# Patient Record
Sex: Female | Born: 1970 | Race: White | Hispanic: No | Marital: Married | Smoking: Never smoker
Health system: Southern US, Community
[De-identification: ages and names within clinical notes are randomized; demographics above are authoritative.]

## PROBLEM LIST (undated history)

## (undated) DIAGNOSIS — G8929 Other chronic pain: Secondary | ICD-10-CM

## (undated) DIAGNOSIS — M549 Dorsalgia, unspecified: Secondary | ICD-10-CM

## (undated) DIAGNOSIS — R519 Headache, unspecified: Secondary | ICD-10-CM

## (undated) DIAGNOSIS — R002 Palpitations: Secondary | ICD-10-CM

## (undated) HISTORY — DX: Palpitations: R00.2

---

## 2009-03-29 ENCOUNTER — Ambulatory Visit: Payer: Self-pay | Admitting: Diagnostic Radiology

## 2009-03-29 ENCOUNTER — Emergency Department (HOSPITAL_BASED_OUTPATIENT_CLINIC_OR_DEPARTMENT_OTHER): Admission: EM | Admit: 2009-03-29 | Discharge: 2009-03-29 | Payer: Self-pay | Admitting: Emergency Medicine

## 2010-05-28 LAB — CBC
MCHC: 34.9 g/dL (ref 30.0–36.0)
RBC: 4.12 MIL/uL (ref 3.87–5.11)
WBC: 6.4 10*3/uL (ref 4.0–10.5)

## 2010-05-28 LAB — COMPREHENSIVE METABOLIC PANEL
AST: 21 U/L (ref 0–37)
Albumin: 4.5 g/dL (ref 3.5–5.2)
Alkaline Phosphatase: 67 U/L (ref 39–117)
Chloride: 110 mEq/L (ref 96–112)
Potassium: 3.4 mEq/L — ABNORMAL LOW (ref 3.5–5.1)
Sodium: 142 mEq/L (ref 135–145)
Total Bilirubin: 0.3 mg/dL (ref 0.3–1.2)

## 2010-05-28 LAB — LIPASE, BLOOD: Lipase: 375 U/L — ABNORMAL HIGH (ref 23–300)

## 2010-05-28 LAB — PROTIME-INR: Prothrombin Time: 12.3 seconds (ref 11.6–15.2)

## 2010-05-28 LAB — POCT CARDIAC MARKERS
CKMB, poc: <1 ng/mL — ABNORMAL LOW (ref 1.0–8.0)
Myoglobin, poc: 41.3 ng/mL (ref 12–200)

## 2010-05-28 LAB — DIFFERENTIAL
Basophils Relative: 0 % (ref 0–1)
Eosinophils Absolute: 0.2 10*3/uL (ref 0.0–0.7)
Monocytes Relative: 5 % (ref 3–12)
Neutrophils Relative %: 60 % (ref 43–77)

## 2019-05-08 ENCOUNTER — Ambulatory Visit: Payer: Self-pay | Attending: Internal Medicine

## 2019-05-08 DIAGNOSIS — Z23 Encounter for immunization: Secondary | ICD-10-CM | POA: Insufficient documentation

## 2019-05-08 NOTE — Progress Notes (Signed)
   Covid-19 Vaccination Clinic  Name:  Christina Ramos    MRN: [id number] DOB: 12/22/1970  05/08/2019  [name] was observed post Covid-19 immunization for 15 minutes without incidence. She was provided with Vaccine Information Sheet and instruction to access the V-Safe system.   [name] was instructed to call 911 with any severe reactions post vaccine: Marland Kitchen Difficulty breathing  . Swelling of your face and throat  . A fast heartbeat  . A bad rash all over your body  . Dizziness and weakness    Immunizations Administered    Name Date Dose VIS Date Route   Pfizer COVID-19 Vaccine 05/08/2019  1:59 PM 0.3 mL 02/19/2019 Intramuscular   Manufacturer: [employer], [employer]   Lot: [id number]   NDC: [address]-5

## 2019-05-29 ENCOUNTER — Ambulatory Visit: Payer: Self-pay | Attending: Internal Medicine

## 2019-05-29 DIAGNOSIS — Z23 Encounter for immunization: Secondary | ICD-10-CM

## 2019-05-29 NOTE — Progress Notes (Signed)
   Covid-19 Vaccination Clinic  Name:  Christina Ramos    MRN: [id number] DOB: 1970-10-28  05/29/2019  [name] was observed post Covid-19 immunization for 15 minutes without incident. She was provided with Vaccine Information Sheet and instruction to access the V-Safe system.   Ms. Park was instructed to call 911 with any severe reactions post vaccine: Marland Kitchen Difficulty breathing  . Swelling of face and throat  . A fast heartbeat  . A bad rash all over body  . Dizziness and weakness   Immunizations Administered    Name Date Dose VIS Date Route   Pfizer COVID-19 Vaccine 05/29/2019  2:10 PM 0.3 mL 02/19/2019 Intramuscular   Manufacturer: [employer], [employer]   Lot: [id number]   NDC: [address]-2

## 2019-12-26 ENCOUNTER — Emergency Department (HOSPITAL_BASED_OUTPATIENT_CLINIC_OR_DEPARTMENT_OTHER)
Admission: EM | Admit: 2019-12-26 | Discharge: 2019-12-26 | Disposition: A | Discharge status: Home / Self Care | Payer: 59 | Attending: Emergency Medicine | Admitting: Emergency Medicine

## 2019-12-26 ENCOUNTER — Encounter (HOSPITAL_BASED_OUTPATIENT_CLINIC_OR_DEPARTMENT_OTHER): Payer: Self-pay | Admitting: *Deleted

## 2019-12-26 ENCOUNTER — Other Ambulatory Visit: Payer: Self-pay

## 2019-12-26 DIAGNOSIS — T402X5A Adverse effect of other opioids, initial encounter: Secondary | ICD-10-CM | POA: Diagnosis not present

## 2019-12-26 DIAGNOSIS — R42 Dizziness and giddiness: Secondary | ICD-10-CM | POA: Diagnosis not present

## 2019-12-26 DIAGNOSIS — R0602 Shortness of breath: Secondary | ICD-10-CM | POA: Diagnosis not present

## 2019-12-26 DIAGNOSIS — R0600 Dyspnea, unspecified: Secondary | ICD-10-CM

## 2019-12-26 DIAGNOSIS — T50905A Adverse effect of unspecified drugs, medicaments and biological substances, initial encounter: Secondary | ICD-10-CM

## 2019-12-26 HISTORY — DX: Dorsalgia, unspecified: M54.9

## 2019-12-26 HISTORY — DX: Other chronic pain: G89.29

## 2019-12-26 HISTORY — DX: Headache, unspecified: R51.9

## 2019-12-26 LAB — CBC WITH DIFFERENTIAL/PLATELET
Abs Immature Granulocytes: 0.04 10*3/uL (ref 0.00–0.07)
Basophils Absolute: 0 10*3/uL (ref 0.0–0.1)
Basophils Relative: 0 %
Eosinophils Absolute: 0.3 10*3/uL (ref 0.0–0.5)
Eosinophils Relative: 4 %
HCT: 37.7 % (ref 36.0–46.0)
Hemoglobin: 13.1 g/dL (ref 12.0–15.0)
Immature Granulocytes: 1 %
Lymphocytes Relative: 35 %
Lymphs Abs: 2.5 10*3/uL (ref 0.7–4.0)
MCH: 31.4 pg (ref 26.0–34.0)
MCHC: 34.7 g/dL (ref 30.0–36.0)
MCV: 90.4 fL (ref 80.0–100.0)
Monocytes Absolute: 0.4 10*3/uL (ref 0.1–1.0)
Monocytes Relative: 5 %
Neutro Abs: 4.1 10*3/uL (ref 1.7–7.7)
Neutrophils Relative %: 55 %
Platelets: 255 10*3/uL (ref 150–400)
RBC: 4.17 MIL/uL (ref 3.87–5.11)
RDW: 11.9 % (ref 11.5–15.5)
WBC: 7.4 10*3/uL (ref 4.0–10.5)
nRBC: 0 % (ref 0.0–0.2)

## 2019-12-26 LAB — COMPREHENSIVE METABOLIC PANEL
ALT: 27 U/L (ref 0–44)
AST: 21 U/L (ref 15–41)
Albumin: 4.4 g/dL (ref 3.5–5.0)
Alkaline Phosphatase: 46 U/L (ref 38–126)
Anion gap: 11 (ref 5–15)
BUN: 21 mg/dL — ABNORMAL HIGH (ref 6–20)
CO2: 21 mmol/L — ABNORMAL LOW (ref 22–32)
Calcium: 9.1 mg/dL (ref 8.9–10.3)
Chloride: 103 mmol/L (ref 98–111)
Creatinine, Ser: 0.93 mg/dL (ref 0.44–1.00)
GFR, Estimated: >60 mL/min (ref >60)
Glucose, Bld: 141 mg/dL — ABNORMAL HIGH (ref 70–99)
Potassium: 3.7 mmol/L (ref 3.5–5.1)
Sodium: 135 mmol/L (ref 135–145)
Total Bilirubin: 0.4 mg/dL (ref 0.3–1.2)
Total Protein: 7.6 g/dL (ref 6.5–8.1)

## 2019-12-26 LAB — BRAIN NATRIURETIC PEPTIDE: B Natriuretic Peptide: 36.5 pg/mL (ref 0.0–100.0)

## 2019-12-26 LAB — TROPONIN I (HIGH SENSITIVITY)
Troponin I (High Sensitivity): 2 ng/L (ref <18)
Troponin I (High Sensitivity): 2 ng/L (ref <18)

## 2019-12-26 MED ORDER — LORAZEPAM 1 MG PO TABS
1.0000 mg | ORAL_TABLET | Freq: Once | ORAL | Status: AC
  Administered 2019-12-26: 1 mg via ORAL
  Filled 2019-12-26: qty 1

## 2019-12-26 NOTE — ED Triage Notes (Addendum)
Pt states she has been taking hydrocodone for the past couple days for acute back pain.  States tonight she felt like she was "breathing shallow" and concerned about her medications "interacting". Also c/o feeling "light headed"  states she did a tele MD visit and was told she should be seen in the ED. No other complaints. Denies c/p.

## 2019-12-26 NOTE — ED Notes (Signed)
Pt endorses not driving

## 2019-12-26 NOTE — ED Provider Notes (Addendum)
[facility] EMERGENCY DEPARTMENT Provider Note   CSN: [id number] Arrival date & time: 12/26/19  0315     History Chief Complaint  Patient presents with  . Medication Reaction    Christina Ramos is a 49 y.o. female.  HPI     49 year old female comes in a chief complaint of medication side effect. Patient was recently diagnosed with sciatica and started on Norco.  She has been taking 1 tablet every night for the last 3 nights.  Tonight she took her medicine at midnight and went to bed around 12:30 AM.  She started noticing that her respirations were shallow and that she was gasping for air.  She called the nurse line and was advised to come to the ER.  Review of system is positive for lightheadedness.  Patient denies any headaches, numbness, tingling, vision change, slurred speech.  She denies any vomiting, diarrhea and her p.o. intake has been normal.  No history of strokes.  Patient has history of migraines and is prediabetic.  She does not smoke, denies any alcohol consumption.  Past Medical History:  Diagnosis Date  . Back pain   . Chronic headaches     There are no problems to display for this patient.   History reviewed. No pertinent surgical history.   OB History   No obstetric history on file.     No family history on file.  Social History   Tobacco Use  . Smoking status: Never Smoker  . Smokeless tobacco: Never Used  Substance Use Topics  . Alcohol use: Never  . Drug use: Never    Home Medications Prior to Admission medications   Medication Sig Start Date End Date Taking? Authorizing Provider  HYDROcodone-acetaminophen (NORCO/VICODIN) 5-325 MG tablet Take by mouth. 12/23/19  Yes [provider]  topiramate (TOPAMAX) 50 MG tablet Take 3 tablets by mouth daily. 09/06/19  Yes [provider]  verapamil (VERELAN PM) 180 MG 24 hr capsule Take 1 capsule by mouth daily. 02/14/15  Yes [provider]  loratadine (CLARITIN)  10 MG tablet Take by mouth.    [provider]    Allergies    Amoxicillin, Penicillins, and Sulfa antibiotics  Review of Systems   Review of Systems  Constitutional: Positive for activity change.  Eyes: Negative for visual disturbance.  Respiratory: Positive for shortness of breath.   Cardiovascular: Negative for chest pain.  Neurological: Positive for dizziness and light-headedness. Negative for seizures, syncope, facial asymmetry, speech difficulty, weakness, numbness and headaches.  All other systems reviewed and are negative.   Physical Exam Updated Vital Signs BP 97/67 (BP Location: Left Arm)   Pulse 62   Temp 97.9 F (36.6 C)   Resp 19   Ht 5' (1.524 m)   Wt 72.6 kg   SpO2 97%   BMI 31.25 kg/m   Physical Exam Vitals and nursing note reviewed.  Constitutional:      Appearance: She is well-developed.  HENT:     Head: Normocephalic and atraumatic.  Eyes:     Pupils: Pupils are equal, round, and reactive to light.  Cardiovascular:     Rate and Rhythm: Normal rate and regular rhythm.     Heart sounds: Normal heart sounds. No murmur heard.   Pulmonary:     Effort: Pulmonary effort is normal. No respiratory distress.  Abdominal:     General: There is no distension.     Palpations: Abdomen is soft.     Tenderness: There is no  abdominal tenderness. There is no guarding or rebound.  Musculoskeletal:     Cervical back: Neck supple.  Skin:    General: Skin is warm and dry.  Neurological:     General: No focal deficit present.     Mental Status: She is alert and oriented to person, place, and time.     Cranial Nerves: No cranial nerve deficit.     Sensory: No sensory deficit.     Motor: No weakness.     Coordination: Coordination normal.     ED Results / Procedures / Treatments   Labs (all labs ordered are listed, but only abnormal results are displayed) Labs Reviewed  COMPREHENSIVE METABOLIC PANEL - Abnormal; Notable for the following components:        Result Value   CO2 21 (*)    Glucose, Bld 141 (*)    BUN 21 (*)    All other components within normal limits  CBC WITH DIFFERENTIAL/PLATELET  BRAIN NATRIURETIC PEPTIDE  TROPONIN I (HIGH SENSITIVITY)  TROPONIN I (HIGH SENSITIVITY)  TROPONIN I (HIGH SENSITIVITY)    EKG None   Date: 12/26/2019  Rate: 62  Rhythm: normal sinus rhythm  QRS Axis: normal  Intervals: normal  ST/T Wave abnormalities: normal  Conduction Disutrbances: none  Narrative Interpretation: unremarkable      Radiology No results found.  Procedures Procedures (including critical care time)  Medications Ordered in ED Medications  LORazepam (ATIVAN) tablet 1 mg (has no administration in time range)    ED Course  I have reviewed the triage vital signs and the nursing notes.  Pertinent labs & imaging results that were available during my care of the patient were reviewed by me and considered in my medical decision making (see chart for details).  Clinical Course as of Dec 25 699  Wynelle Link Dec 26, 2019  7616 Patient reassessed.  She reports that she continues to have some shortness of breath type feeling.  Her dizziness is resolved.  Overall, she is feeling better than she did when she first came.  Plan is to get delta troponin to rule out ACS.  If her work-up is negative she can go home with close PCP follow-up.  Patient is comfortable with the plan.   [AN]    Clinical Course User Index [AN] Derwood Kaplan, MD   MDM Rules/Calculators/A&P                          49 year old female comes in a chief complaint of shallow respirations/shortness of breath and feeling dizzy.  Her symptoms started about 30 minutes after she took Norco.  She typically does not take pain meds.  She did not have similar symptoms yesterday, or day before when she started taking Norco.  She did take Norco later in the night tonight.  Patient is having dizziness but her neuro exam is reassuring.  We ambulated her and she had no  ataxia.  There was no dysmetria on neuro exam or nystagmus.  Doubt that she is having stroke.    We will get basic labs in the ED and monitor the patient.  Anticipate discharge.  Final Clinical Impression(s) / ED Diagnoses Final diagnoses:  Medication side effect, initial encounter  Dyspnea, unspecified type    Rx / DC Orders ED Discharge Orders    None       Derwood Kaplan, MD 12/26/19 0737    Derwood Kaplan, MD 12/26/19 1062

## 2019-12-26 NOTE — ED Notes (Signed)
Pt discharged to home. Discharge instructions have been discussed with patient and/or family members. Pt verbally acknowledges understanding d/c instructions, and endorses comprehension to checkout at registration before leaving.  °

## 2019-12-26 NOTE — ED Provider Notes (Signed)
7:06 AM Care assumed from Dr. Rhunette Croft.  At time of transfer of care, patient is awaiting results of delta troponin.  Plan of care will be to discharge home if delta troponin is negative and follow-up with PCP.  7:38 AM Delta troponin is negative.  I reassessed patient and she is feeling well.  She agrees with plan for discharge home with close PCP follow-up and good return precautions.  She had no other questions or concerns and was discharged in good condition.  Clinical Impression: 1. Medication side effect, initial encounter   2. Dyspnea, unspecified type     Disposition: Discharge  Condition: Good  I have discussed the results, Dx and Tx plan with the pt(& family if present). He/she/they expressed understanding and agree(s) with the plan. Discharge instructions discussed at great length. Strict return precautions discussed and pt &/or family have verbalized understanding of the instructions. No further questions at time of discharge.    New Prescriptions   No medications on file    Follow Up: Your PCP     [facility] [facility] [address] [id number] [address] [place] [address] [phone]         Izayiah Tibbitts, Canary Brim, MD 12/26/19 [phone]

## 2019-12-26 NOTE — Discharge Instructions (Signed)
Your work-up today was overall reassuring.  The heart enzymes were negative both times they were checked.  Per the previous team's plan, we feel you are safe for discharge home with plans to follow-up with your primary doctor for reassessment as well as good return precautions if anything changes or worsens.  Please rest and stay hydrated.  If any symptoms change or worsen or return, please return to the nearest emergency department.

## 2019-12-26 NOTE — ED Notes (Signed)
Pt ambulatory with steady gait to restroom 

## 2019-12-26 NOTE — ED Notes (Signed)
ED Provider at bedside. 

## 2022-05-05 ENCOUNTER — Other Ambulatory Visit: Payer: Self-pay

## 2022-05-05 ENCOUNTER — Emergency Department (HOSPITAL_BASED_OUTPATIENT_CLINIC_OR_DEPARTMENT_OTHER): Payer: Managed Care, Other (non HMO)

## 2022-05-05 DIAGNOSIS — R002 Palpitations: Secondary | ICD-10-CM | POA: Diagnosis present

## 2022-05-05 DIAGNOSIS — R079 Chest pain, unspecified: Secondary | ICD-10-CM | POA: Insufficient documentation

## 2022-05-05 LAB — BASIC METABOLIC PANEL
Anion gap: 7 (ref 5–15)
BUN: 19 mg/dL (ref 6–20)
CO2: 22 mmol/L (ref 22–32)
Calcium: 8.6 mg/dL — ABNORMAL LOW (ref 8.9–10.3)
Chloride: 108 mmol/L (ref 98–111)
Creatinine, Ser: 1.03 mg/dL — ABNORMAL HIGH (ref 0.44–1.00)
GFR, Estimated: >60 mL/min (ref >60)
Glucose, Bld: 150 mg/dL — ABNORMAL HIGH (ref 70–99)
Potassium: 3.4 mmol/L — ABNORMAL LOW (ref 3.5–5.1)
Sodium: 137 mmol/L (ref 135–145)

## 2022-05-05 LAB — CBC
HCT: 37.9 % (ref 36.0–46.0)
Hemoglobin: 13.1 g/dL (ref 12.0–15.0)
MCH: 31 pg (ref 26.0–34.0)
MCHC: 34.6 g/dL (ref 30.0–36.0)
MCV: 89.6 fL (ref 80.0–100.0)
Platelets: 274 10*3/uL (ref 150–400)
RBC: 4.23 MIL/uL (ref 3.87–5.11)
RDW: 11.7 % (ref 11.5–15.5)
WBC: 7.2 10*3/uL (ref 4.0–10.5)
nRBC: 0 % (ref 0.0–0.2)

## 2022-05-05 LAB — TROPONIN I (HIGH SENSITIVITY): Troponin I (High Sensitivity): <2 ng/L (ref <18)

## 2022-05-05 NOTE — ED Triage Notes (Signed)
Pt arrives with c/o palpitations that started about a week ago. Pt endorses dizziness and chest pressure.. Per pt, palpitations are intermittent and worse at night. Pt denies SOB.

## 2022-05-06 ENCOUNTER — Emergency Department (HOSPITAL_BASED_OUTPATIENT_CLINIC_OR_DEPARTMENT_OTHER)
Admission: EM | Admit: 2022-05-06 | Discharge: 2022-05-06 | Disposition: A | Discharge status: Home / Self Care | Payer: Managed Care, Other (non HMO) | Attending: Emergency Medicine | Admitting: Emergency Medicine

## 2022-05-06 DIAGNOSIS — R002 Palpitations: Secondary | ICD-10-CM

## 2022-05-06 NOTE — ED Provider Notes (Signed)
[place] [place] Provider Note   CSN: [id number] Arrival date & time: 05/05/22  2035     History  Chief Complaint  Patient presents with   Palpitations    Christina Ramos is a 52 y.o. female.  Patient is a 52 year old female with history of migraines and prediabetes.  Patient presenting today for evaluation of palpitations.  For the past week, she reports episodes of a fluttering sensation in her chest.  This occurs intermittently, but mostly at night.  She denies any shortness of breath, nausea, diaphoresis, or radiation.  She denies any exertional symptoms.  She does describe some stress with family matters and her new diagnosis of prediabetes.  She denies alcohol or caffeine consumption.  The history is provided by the patient.       Home Medications Prior to Admission medications   Medication Sig Start Date End Date Taking? Authorizing Provider  HYDROcodone-acetaminophen (NORCO/VICODIN) 5-325 MG tablet Take by mouth. 12/23/19   [provider]  loratadine (CLARITIN) 10 MG tablet Take by mouth.    [provider]  topiramate (TOPAMAX) 50 MG tablet Take 3 tablets by mouth daily. 09/06/19   [provider]  verapamil (VERELAN PM) 180 MG 24 hr capsule Take 1 capsule by mouth daily. 02/14/15   [provider]      Allergies    Amoxicillin, Penicillins, and Sulfa antibiotics    Review of Systems   Review of Systems  All other systems reviewed and are negative.   Physical Exam Updated Vital Signs BP 137/80   Pulse 60   Temp 98.2 F (36.8 C) (Oral)   Resp 19   Wt 72.6 kg   SpO2 99%   BMI 31.25 kg/m  Physical Exam Vitals and nursing note reviewed.  Constitutional:      General: She is not in acute distress.    Appearance: She is well-developed. She is not diaphoretic.  HENT:     Head: Normocephalic and atraumatic.  Cardiovascular:     Rate and Rhythm: Normal rate and regular rhythm.      Heart sounds: No murmur heard.    No friction rub. No gallop.  Pulmonary:     Effort: Pulmonary effort is normal. No respiratory distress.     Breath sounds: Normal breath sounds. No wheezing.  Abdominal:     General: Bowel sounds are normal. There is no distension.     Palpations: Abdomen is soft.     Tenderness: There is no abdominal tenderness.  Musculoskeletal:        General: Normal range of motion.     Cervical back: Normal range of motion and neck supple.  Skin:    General: Skin is warm and dry.  Neurological:     General: No focal deficit present.     Mental Status: She is alert and oriented to person, place, and time.     ED Results / Procedures / Treatments   Labs (all labs ordered are listed, but only abnormal results are displayed) Labs Reviewed  BASIC METABOLIC PANEL - Abnormal; Notable for the following components:      Result Value   Potassium 3.4 (*)    Glucose, Bld 150 (*)    Creatinine, Ser 1.03 (*)    Calcium 8.6 (*)    All other components within normal limits  CBC  TROPONIN I (HIGH SENSITIVITY)  TROPONIN I (HIGH SENSITIVITY)    EKG EKG Interpretation  Date/Time:  Sunday May 05 2022 21:08:48 EST Ventricular Rate:  64 PR Interval:  140 QRS Duration: 73 QT Interval:  428 QTC Calculation: 442 R Axis:   47 Text Interpretation: Sinus rhythm Low voltage, precordial leads Unchanged from 12/26/2019 Confirmed by Veryl Speak [phone]) on 05/05/2022 11:12:44 PM  Radiology DG Chest 2 View  Result Date: 05/05/2022 CLINICAL DATA:  Chest pain EXAM: CHEST - 2 VIEW COMPARISON:  None Available. FINDINGS: The heart size and mediastinal contours are within normal limits. Both lungs are clear. The visualized skeletal structures are unremarkable. IMPRESSION: No active cardiopulmonary disease. Electronically Signed   By: Fidela Salisbury M.D.   On: 05/05/2022 21:35    Procedures Procedures    Medications Ordered in ED Medications - No data to display  ED  Course/ Medical Decision Making/ A&P  Patient is a 52 year old female presenting with complaints of palpitations as described in the HPI.  She arrives here with stable vital signs and physical examination which is basically unremarkable.  Workup initiated including CBC, metabolic panel, and troponin.  Studies all unremarkable with the exception of a mildly elevated glucose of 150.  Chest x-ray shows no acute process.  Patient is well-appearing and has been in sinus rhythm throughout her entire ED stay.  Her EKG shows a sinus rhythm with no acute changes.  I am uncertain as to the etiology of her symptoms, but doubt an acute cardiac event.  Her troponin is negative and EKG is unchanged.  I will refer her to cardiology to discuss these episodes.  She may benefit from a Holter monitor.  She understands to return in the meantime if symptoms worsen or change.  Final Clinical Impression(s) / ED Diagnoses Final diagnoses:  None    Rx / DC Orders ED Discharge Orders     None         Veryl Speak, MD 05/06/22 [phone]

## 2022-05-06 NOTE — Discharge Instructions (Signed)
Follow-up with cardiology in the next few days.  The contact information for the cardiology clinic on [facility] has been provided in this discharge summary for you to call and make these arrangements.  Return to the emergency department in the meantime if you develop severe chest pain, difficulty breathing, or for other new and concerning symptoms.

## 2022-06-14 ENCOUNTER — Ambulatory Visit: Payer: Managed Care, Other (non HMO) | Admitting: Internal Medicine
